# Patient Record
Sex: Male | Born: 1935 | Race: White | Hispanic: No
Health system: Southern US, Community
[De-identification: ages and names within clinical notes are randomized; demographics above are authoritative.]

---

## 2019-10-13 ENCOUNTER — Other Ambulatory Visit: Payer: Self-pay | Admitting: Adult Health Nurse Practitioner

## 2019-10-13 DIAGNOSIS — I480 Paroxysmal atrial fibrillation: Secondary | ICD-10-CM

## 2019-10-18 ENCOUNTER — Ambulatory Visit (INDEPENDENT_AMBULATORY_CARE_PROVIDER_SITE_OTHER): Payer: No Typology Code available for payment source

## 2019-10-18 ENCOUNTER — Other Ambulatory Visit: Payer: Self-pay

## 2019-10-18 DIAGNOSIS — I480 Paroxysmal atrial fibrillation: Secondary | ICD-10-CM

## 2019-10-18 LAB — ECHOCARDIOGRAM COMPLETE
Area-P 1/2: 2.26 cm2
S' Lateral: 1.9 cm

## 2019-12-22 ENCOUNTER — Emergency Department: Payer: No Typology Code available for payment source

## 2019-12-22 ENCOUNTER — Emergency Department
Admission: EM | Admit: 2019-12-22 | Discharge: 2019-12-22 | Disposition: A | Discharge status: Home / Self Care | Payer: No Typology Code available for payment source | Attending: Emergency Medicine | Admitting: Emergency Medicine

## 2019-12-22 ENCOUNTER — Other Ambulatory Visit: Payer: Self-pay

## 2019-12-22 DIAGNOSIS — R61 Generalized hyperhidrosis: Secondary | ICD-10-CM | POA: Diagnosis not present

## 2019-12-22 DIAGNOSIS — R55 Syncope and collapse: Secondary | ICD-10-CM | POA: Diagnosis not present

## 2019-12-22 LAB — URINALYSIS, COMPLETE (UACMP) WITH MICROSCOPIC
Bilirubin Urine: NEGATIVE
Glucose, UA: 50 mg/dL — AB
Hgb urine dipstick: NEGATIVE
Ketones, ur: NEGATIVE mg/dL
Leukocytes,Ua: NEGATIVE
Nitrite: NEGATIVE
Protein, ur: NEGATIVE mg/dL
Specific Gravity, Urine: 1.01 (ref 1.005–1.030)
Squamous Epithelial / HPF: NONE SEEN (ref 0–5)
pH: 5 (ref 5.0–8.0)

## 2019-12-22 LAB — HEPATIC FUNCTION PANEL
ALT: 16 U/L (ref 0–44)
AST: 19 U/L (ref 15–41)
Albumin: 3.8 g/dL (ref 3.5–5.0)
Alkaline Phosphatase: 56 U/L (ref 38–126)
Bilirubin, Direct: 0.1 mg/dL (ref 0.0–0.2)
Indirect Bilirubin: 0.6 mg/dL (ref 0.3–0.9)
Total Bilirubin: 0.7 mg/dL (ref 0.3–1.2)
Total Protein: 6.8 g/dL (ref 6.5–8.1)

## 2019-12-22 LAB — TROPONIN I (HIGH SENSITIVITY): Troponin I (High Sensitivity): 13 ng/L (ref <18)

## 2019-12-22 LAB — CBC
HCT: 37.2 % — ABNORMAL LOW (ref 39.0–52.0)
Hemoglobin: 12.3 g/dL — ABNORMAL LOW (ref 13.0–17.0)
MCH: 31.6 pg (ref 26.0–34.0)
MCHC: 33.1 g/dL (ref 30.0–36.0)
MCV: 95.6 fL (ref 80.0–100.0)
Platelets: 220 10*3/uL (ref 150–400)
RBC: 3.89 MIL/uL — ABNORMAL LOW (ref 4.22–5.81)
RDW: 13 % (ref 11.5–15.5)
WBC: 8.9 10*3/uL (ref 4.0–10.5)
nRBC: 0 % (ref 0.0–0.2)

## 2019-12-22 LAB — BASIC METABOLIC PANEL
Anion gap: 12 (ref 5–15)
BUN: 48 mg/dL — ABNORMAL HIGH (ref 8–23)
CO2: 23 mmol/L (ref 22–32)
Calcium: 8.5 mg/dL — ABNORMAL LOW (ref 8.9–10.3)
Chloride: 105 mmol/L (ref 98–111)
Creatinine, Ser: 1.49 mg/dL — ABNORMAL HIGH (ref 0.61–1.24)
GFR, Estimated: 42 mL/min — ABNORMAL LOW (ref >60)
Glucose, Bld: 147 mg/dL — ABNORMAL HIGH (ref 70–99)
Potassium: 4.4 mmol/L (ref 3.5–5.1)
Sodium: 140 mmol/L (ref 135–145)

## 2019-12-22 LAB — LIPASE, BLOOD: Lipase: 40 U/L (ref 11–51)

## 2019-12-22 NOTE — Discharge Instructions (Addendum)
Return to the ER for new, worsening, or persistent severe sweating, dizziness or lightheadedness, difficulty breathing, weakness, chest pain, or any other new or worsening symptoms that concern you.  Follow-up with your regular doctor.

## 2019-12-22 NOTE — ED Provider Notes (Signed)
[facility] Emergency Department Provider Note ____________________________________________   First MD Initiated Contact with Patient 12/22/19 1554     (approximate)  I have reviewed the triage vital signs and the nursing notes.   HISTORY  Chief Complaint Diaphoresis    HPI Austin Massey is a 86 y.o. male presents with an episode of sweating and near syncope, acute onset around 2 hours ago when he was at home.  The patient states that he started to sweat profusely, felt "woozy," and had abdominal cramping.  This started after he had been straining to have a bowel movement.  Per his daughter, the patient appeared gray.  He denies any associated chest pain or shortness of breath.  He states that subsequently all of the symptoms resolved and he now feels back to normal.  He did not eat anything unusual yesterday or today.  He otherwise has not been constipated recently.   No past medical history on file.  There are no problems to display for this patient.    Prior to Admission medications   Not on File    Allergies Patient has no known allergies.  No family history on file.  Social History Social History   Tobacco Use  . Smoking status: Not on file  Substance Use Topics  . Alcohol use: Not on file  . Drug use: Not on file    Review of Systems  Constitutional: No fever/chills Eyes: No visual changes. ENT: No sore throat. Cardiovascular: Denies chest pain. Respiratory: Denies shortness of breath. Gastrointestinal: No nausea, vomiting, or diarrhea. Genitourinary: Negative for dysuria.  Musculoskeletal: Negative for back pain. Skin: Negative for rash. Neurological: Negative for headaches, focal weakness or numbness.   ____________________________________________   PHYSICAL EXAM:  VITAL SIGNS: ED Triage Vitals  Enc Vitals Group     BP 12/22/19 1548 129/61     Pulse Rate 12/22/19 1548 72     Resp 12/22/19 1548 16     Temp 12/22/19  1548 98.1 F (36.7 C)     Temp src --      SpO2 12/22/19 1548 95 %     Weight 12/22/19 1553 195 lb (88.5 kg)     Height 12/22/19 1553 5\' 10"  (1.778 m)     Head Circumference --      Peak Flow --      Pain Score 12/22/19 1552 0     Pain Loc --      Pain Edu? --      Excl. in GC? --     Constitutional: Alert and oriented.  Very well appearing for age, in no acute distress. Eyes: Conjunctivae are normal.  EOMI.  PERRLA. Head: Atraumatic. Nose: No congestion/rhinnorhea. Mouth/Throat: Mucous membranes are slightly dry. Neck: Normal range of motion.  Cardiovascular: Normal rate, regular rhythm. Grossly normal heart sounds.  Good peripheral circulation. Respiratory: Normal respiratory effort.  No retractions. Lungs CTAB. Gastrointestinal: Soft and nontender. No distention.  Genitourinary: No flank tenderness. Musculoskeletal: No lower extremity edema.  Extremities warm and well perfused.  Neurologic:  Normal speech and language.  Motor intact in all extremities.  Normal coordination.  No gross focal neurologic deficits are appreciated.  Skin:  Skin is warm and dry. No rash noted. Psychiatric: Mood and affect are normal. Speech and behavior are normal.  ____________________________________________   LABS (all labs ordered are listed, but only abnormal results are displayed)  Labs Reviewed  CBC - Abnormal; Notable for the following components:  Result Value   RBC 3.89 (*)    Hemoglobin 12.3 (*)    HCT 37.2 (*)    All other components within normal limits  URINALYSIS, COMPLETE (UACMP) WITH MICROSCOPIC - Abnormal; Notable for the following components:   Color, Urine YELLOW (*)    APPearance CLEAR (*)    Glucose, UA 50 (*)    Bacteria, UA RARE (*)    All other components within normal limits  BASIC METABOLIC PANEL - Abnormal; Notable for the following components:   Glucose, Bld 147 (*)    BUN 48 (*)    Creatinine, Ser 1.49 (*)    Calcium 8.5 (*)    GFR, Estimated 42 (*)     All other components within normal limits  LIPASE, BLOOD  HEPATIC FUNCTION PANEL  TROPONIN I (HIGH SENSITIVITY)  TROPONIN I (HIGH SENSITIVITY)   ____________________________________________  EKG  ED ECG REPORT I, Dionne Bucy, the attending physician, personally viewed and interpreted this ECG.  Date: 12/22/2019 EKG Time: 1543 Rate: 73 Rhythm: normal sinus rhythm Intervals: normal ST/T Wave abnormalities: normal Narrative Interpretation: no evidence of acute ischemia  ____________________________________________  RADIOLOGY  XR abdomen interpreted by me shows a normal bowel gas pattern with no evidence of obstruction  ____________________________________________   PROCEDURES  Procedure(s) performed: No  Procedures  Critical Care performed: No ____________________________________________   INITIAL IMPRESSION / ASSESSMENT AND PLAN / ED COURSE  Pertinent labs & imaging results that were available during my care of the patient were reviewed by me and considered in my medical decision making (see chart for details).  86 year old male presents from home after an episode of abdominal cramping and near syncope with lightheadedness and diaphoresis.  This occurred after he had been straining to have a bowel movement.  The symptoms have subsequently resolved.  He denies any other recent episodes like this.  On exam, the patient is overall very well-appearing for his age.  His vital signs are normal.  Physical exam is unremarkable.  The abdomen is soft and nontender.  EKG is nonischemic.  Overall presentation is most consistent with vasovagal near syncope or other benign etiology.  The patient may be mildly dehydrated.  I have a low suspicion for cardiac etiology given the somewhat gradual onset of the symptoms and the lack of any palpitations, chest pain or shortness of breath.  There is no evidence of PE given the resolved symptoms.  I do not suspect SBO, volvulus, or  other acute abdominal pathology again given the resolved symptoms and lack of any tenderness on exam.  We will obtain basic labs, troponin, UA, abdominal x-ray and reassess.  ----------------------------------------- 6:28 PM on 12/22/2019 -----------------------------------------  Lab work-up is unremarkable. The creatinine is mildly elevated. The patient's daughter, who is an Char[employer] fundraiser, confirms that this is his baseline. Abdominal x-ray shows no acute abnormality. The patient continues to feel well and has had no recurrence of his symptoms while in the ED. He would like to go home. I feel that this is reasonable, and there is no specific indication for further ED observation or admission. I gave the patient and his daughter thorough return precautions, and they expressed understanding. ____________________________________________   FINAL CLINICAL IMPRESSION(S) / ED DIAGNOSES  Final diagnoses:  Near syncope      NEW MEDICATIONS STARTED DURING THIS VISIT:  There are no discharge medications for this patient.    Note:  This document was prepared using Dragon voice recognition software and may include unintentional dictation errors.  Dionne Bucy, MD 12/23/19 [phone]

## 2019-12-22 NOTE — ED Triage Notes (Signed)
Patient reports after eating breakfast he went to have a bowel movement and started having severe diaphoresis and stomach pains. Patient reports episode lasted approx 1 hour. Upon EMS arrival patient reports all symptoms have passed and he feels back to baseline. Patient A&OX3. Ambulatory with steady gait.

## 2021-06-16 IMAGING — CR DG ABD PORTABLE 2V
3 series · 3 of 3 positions shown · non-contrast
Comparison: None.

CLINICAL DATA: Abdominal pain, constipation

EXAM:
PORTABLE ABDOMEN - 2 VIEW

[abdomen erect]
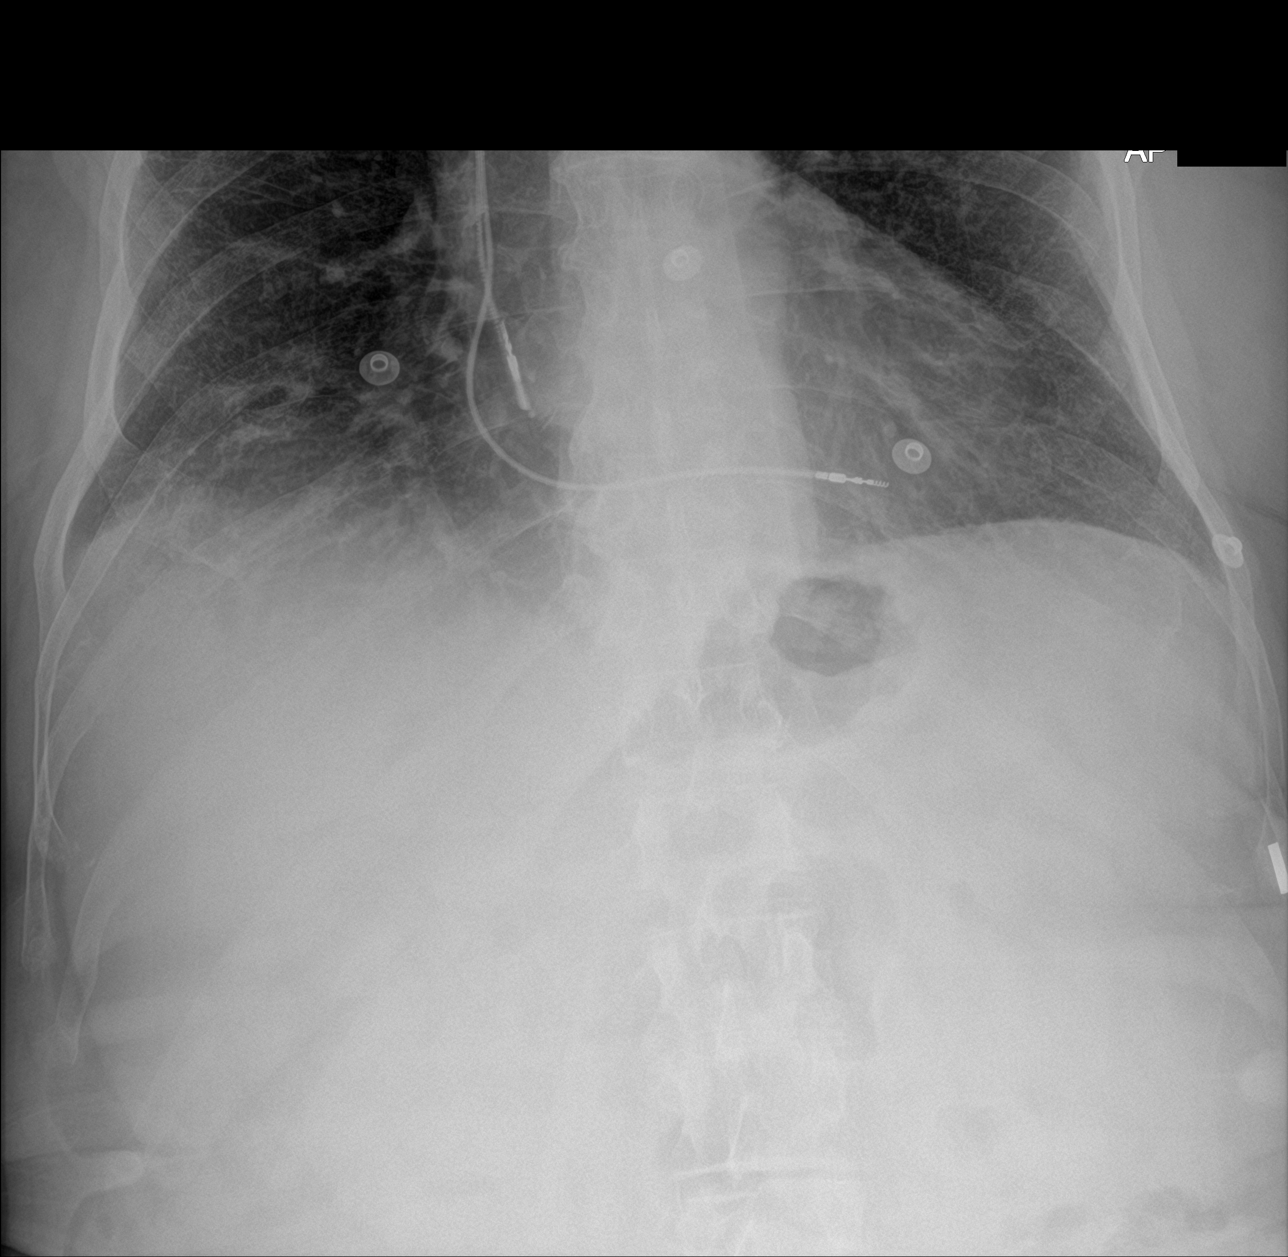

[abdomen supine (1 of 2)]
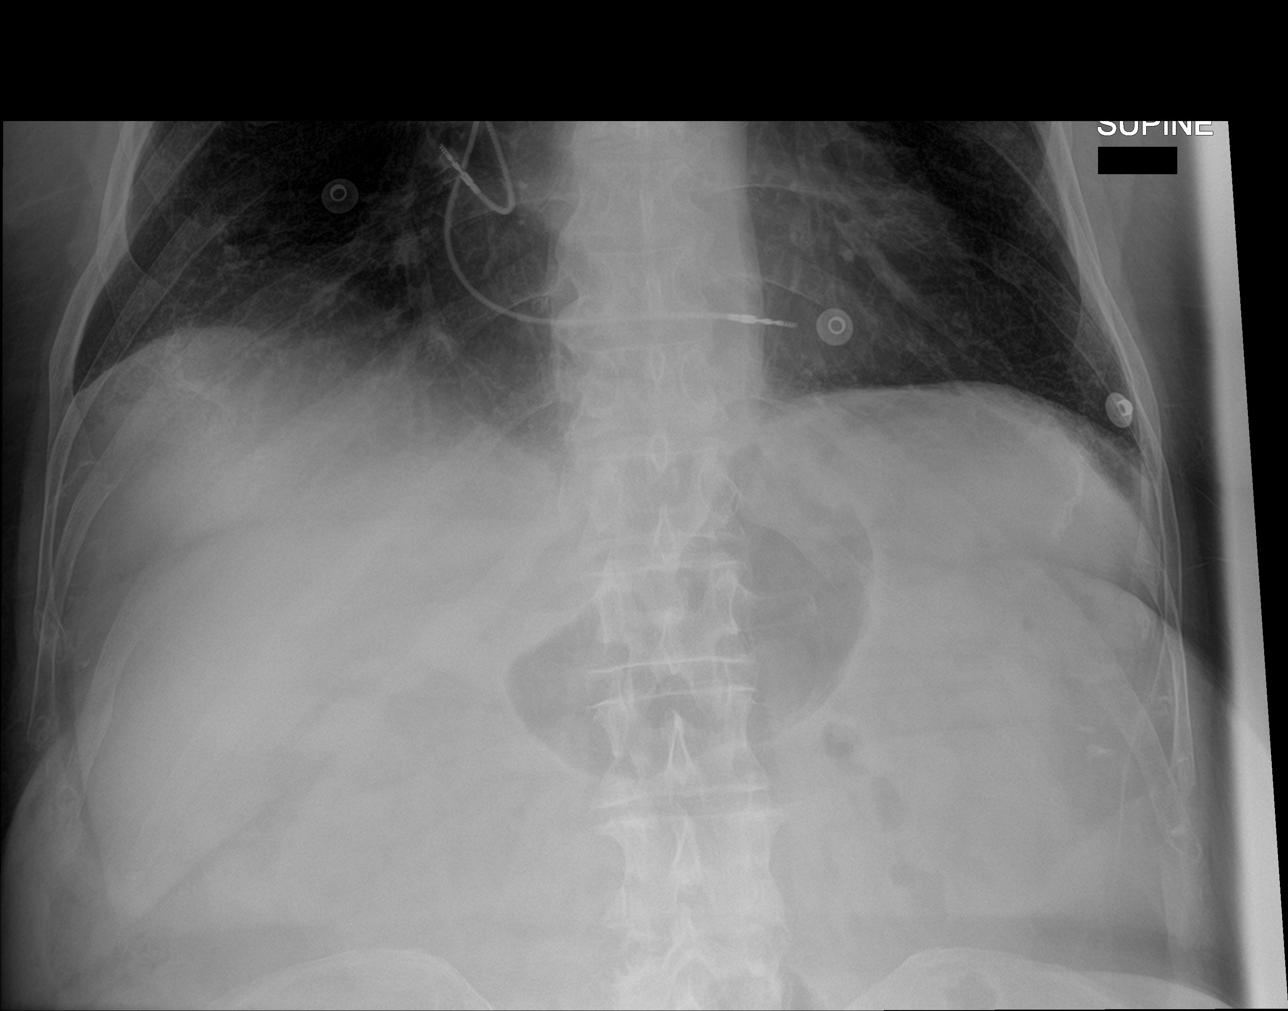

[abdomen supine (2 of 2)]
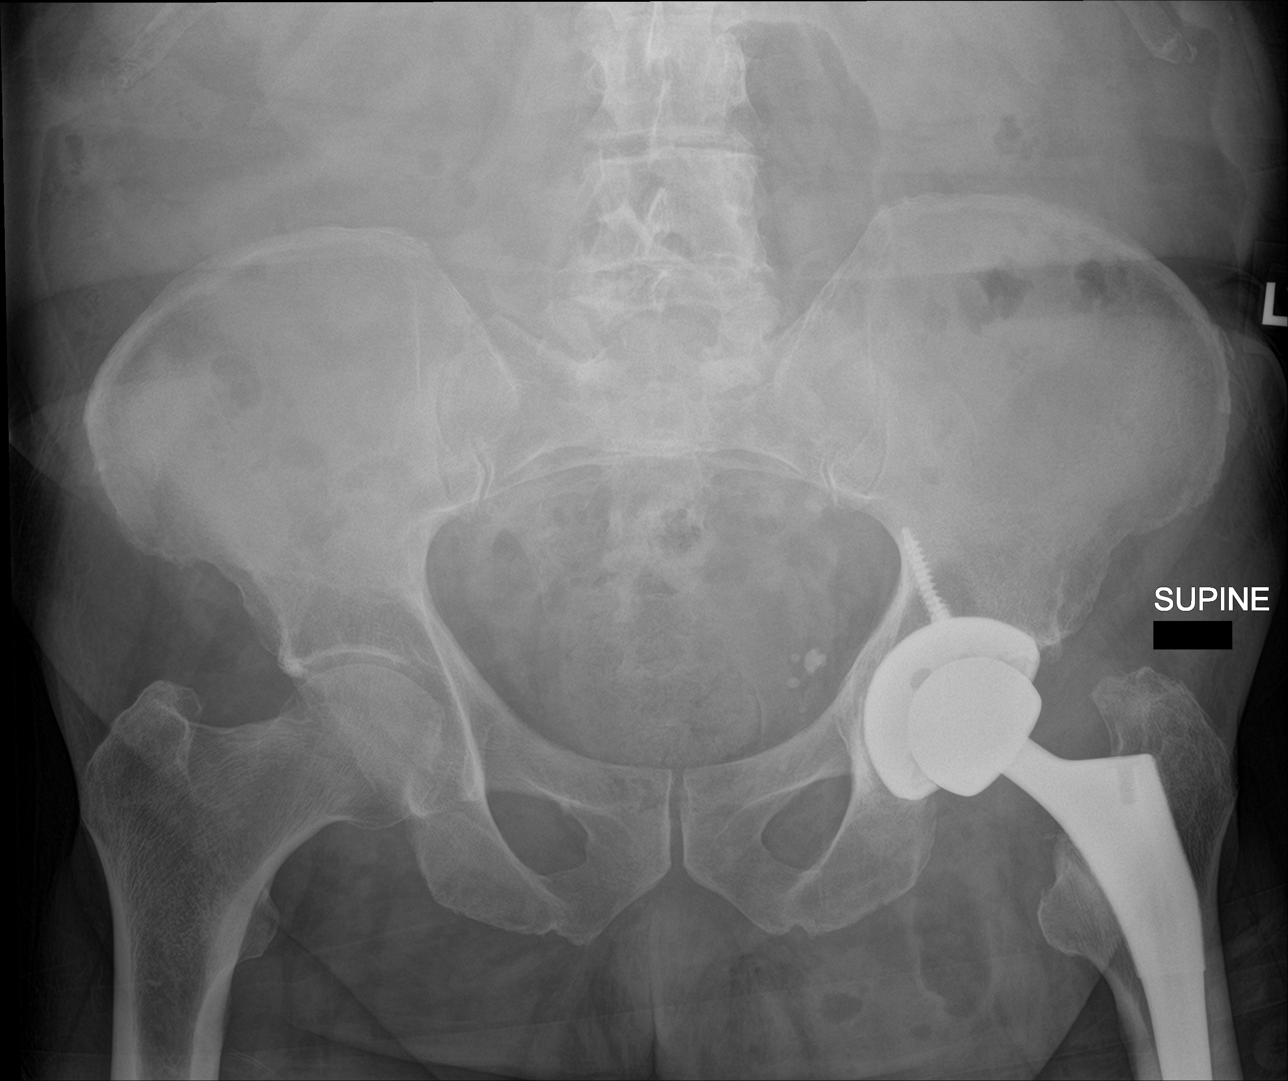

[3 of 3 positions shown; findings below may reference images not displayed]

FINDINGS: Supine and upright frontal views of the abdomen and pelvis are
obtained. Bowel gas pattern is unremarkable without obstruction or
ileus. There are no masses or abnormal calcifications. Vascular
calcifications are seen in the left hemipelvis. No free gas in the
greater peritoneal sac. Lung bases are clear.
IMPRESSION: 1. Unremarkable bowel gas pattern.

## 2023-05-16 ENCOUNTER — Emergency Department

## 2023-05-16 ENCOUNTER — Emergency Department
Admission: EM | Admit: 2023-05-16 | Discharge: 2023-05-16 | Disposition: A | Discharge status: Home / Self Care | Attending: Emergency Medicine | Admitting: Emergency Medicine

## 2023-05-16 DIAGNOSIS — Z7901 Long term (current) use of anticoagulants: Secondary | ICD-10-CM | POA: Diagnosis not present

## 2023-05-16 DIAGNOSIS — N23 Unspecified renal colic: Secondary | ICD-10-CM | POA: Diagnosis not present

## 2023-05-16 DIAGNOSIS — M7989 Other specified soft tissue disorders: Secondary | ICD-10-CM | POA: Insufficient documentation

## 2023-05-16 DIAGNOSIS — M25572 Pain in left ankle and joints of left foot: Secondary | ICD-10-CM

## 2023-05-16 DIAGNOSIS — R109 Unspecified abdominal pain: Secondary | ICD-10-CM | POA: Diagnosis present

## 2023-05-16 LAB — URINALYSIS, ROUTINE W REFLEX MICROSCOPIC
Bilirubin Urine: NEGATIVE
Glucose, UA: NEGATIVE mg/dL
Hgb urine dipstick: NEGATIVE
Ketones, ur: NEGATIVE mg/dL
Leukocytes,Ua: NEGATIVE
Nitrite: NEGATIVE
Protein, ur: NEGATIVE mg/dL
Specific Gravity, Urine: 1.013 (ref 1.005–1.030)
pH: 5 (ref 5.0–8.0)

## 2023-05-16 LAB — CBC
HCT: 38.6 % — ABNORMAL LOW (ref 39.0–52.0)
Hemoglobin: 13.1 g/dL (ref 13.0–17.0)
MCH: 31.3 pg (ref 26.0–34.0)
MCHC: 33.9 g/dL (ref 30.0–36.0)
MCV: 92.1 fL (ref 80.0–100.0)
Platelets: 266 10*3/uL (ref 150–400)
RBC: 4.19 MIL/uL — ABNORMAL LOW (ref 4.22–5.81)
RDW: 12.4 % (ref 11.5–15.5)
WBC: 7.7 10*3/uL (ref 4.0–10.5)
nRBC: 0 % (ref 0.0–0.2)

## 2023-05-16 LAB — COMPREHENSIVE METABOLIC PANEL WITH GFR
ALT: 15 U/L (ref 0–44)
AST: 18 U/L (ref 15–41)
Albumin: 4.1 g/dL (ref 3.5–5.0)
Alkaline Phosphatase: 58 U/L (ref 38–126)
Anion gap: 9 (ref 5–15)
BUN: 42 mg/dL — ABNORMAL HIGH (ref 8–23)
CO2: 25 mmol/L (ref 22–32)
Calcium: 9.1 mg/dL (ref 8.9–10.3)
Chloride: 101 mmol/L (ref 98–111)
Creatinine, Ser: 1.49 mg/dL — ABNORMAL HIGH (ref 0.61–1.24)
GFR, Estimated: 41 mL/min — ABNORMAL LOW (ref >60)
Glucose, Bld: 116 mg/dL — ABNORMAL HIGH (ref 70–99)
Potassium: 4.4 mmol/L (ref 3.5–5.1)
Sodium: 135 mmol/L (ref 135–145)
Total Bilirubin: 0.8 mg/dL (ref 0.0–1.2)
Total Protein: 7.4 g/dL (ref 6.5–8.1)

## 2023-05-16 LAB — LIPASE, BLOOD: Lipase: 43 U/L (ref 11–51)

## 2023-05-16 MED ORDER — IOHEXOL 300 MG/ML  SOLN
80.0000 mL | Freq: Once | INTRAMUSCULAR | Status: AC | PRN
  Administered 2023-05-16: 80 mL via INTRAVENOUS

## 2023-05-16 NOTE — ED Provider Notes (Signed)
 [facility] Provider Note    Event Date/Time   First MD Initiated Contact with Patient 05/16/23 1207     (approximate)   History   Abdominal Pain   HPI  Austin Massey is a 89 y.o. male who presents to the ED for evaluation of Abdominal Pain   I attempted to review [facility] from an [facility] this past January.Anticoagulated on Eliquis  Patient presents to the ED alongside her daughter for evaluation of episode of RLQ abdominal pain earlier this morning as well as 1-2 days of left ankle pain.  They are most concerned about his left ankle pain and recurrence of a DVT.  He is a history of DVT and recently stopped his Eliquis just in the past few days.  Ankle pain atraumatic and is consistent with previous DVT.   Patient reports feeling gassy and upset stomach with "indigestion" intermittently over the past 1 week but no discrete abdominal pain until this morning he had about a 1 hour long episode of RLQ pain without nausea, emesis, stool or urinary changes.  This has since resolved and he reports feeling fine.   Physical Exam   Triage Vital Signs: ED Triage Vitals  Encounter Vitals Group     BP 05/16/23 1150 101/84     Systolic BP Percentile --      Diastolic BP Percentile --      Pulse Rate 05/16/23 1150 73     Resp 05/16/23 1150 16     Temp 05/16/23 1150 97.7 F (36.5 C)     Temp Source 05/16/23 1150 Oral     SpO2 05/16/23 1150 96 %     Weight 05/16/23 1154 190 lb (86.2 kg)     Height 05/16/23 1154 5\' 10"  (1.778 m)     Head Circumference --      Peak Flow --      Pain Score 05/16/23 1153 5     Pain Loc --      Pain Education --      Exclude from Growth Chart --     Most recent vital signs: Vitals:   05/16/23 1300 05/16/23 1415  BP: 118/72   Pulse: (!) 59 64  Resp: 18   Temp:    SpO2: 95% 96%    General: Awake, no distress.  CV:  Good peripheral perfusion.  Resp:  Normal effort.  Abd:  No distention.  Soft and  nontender MSK:  No deformity noted.  Trace pitting edema bilaterally to lower extremities without asymmetry, skin changes, signs of trauma, swelling, bruising or notable changes on exam on the left lower extremity to the area of concern but compared to the right. Neuro:  No focal deficits appreciated. Other:     ED Results / Procedures / Treatments   Labs (all labs ordered are listed, but only abnormal results are displayed) Labs Reviewed  COMPREHENSIVE METABOLIC PANEL WITH GFR - Abnormal; Notable for the following components:      Result Value   Glucose, Bld 116 (*)    BUN 42 (*)    Creatinine, Ser 1.49 (*)    GFR, Estimated 41 (*)    All other components within normal limits  CBC - Abnormal; Notable for the following components:   RBC 4.19 (*)    HCT 38.6 (*)    All other components within normal limits  URINALYSIS, ROUTINE W REFLEX MICROSCOPIC - Abnormal; Notable for the following components:   Color, Urine YELLOW (*)  APPearance HAZY (*)    All other components within normal limits  LIPASE, BLOOD    EKG   RADIOLOGY CT abdomen/pelvis interpreted by me with a bladder stone Ultrasound of the left leg without signs of DVT  Official radiology report(s): US  Venous Img Lower Unilateral Left Result Date: 05/16/2023 CLINICAL DATA:  Left lower extremity swelling and pain for 3 days EXAM: LEFT LOWER EXTREMITY VENOUS DOPPLER ULTRASOUND TECHNIQUE: Gray-scale sonography with graded compression, as well as color Doppler and duplex ultrasound were performed to evaluate the lower extremity deep venous systems from the level of the common femoral vein and including the common femoral, femoral, profunda femoral, popliteal and calf veins including the posterior tibial, peroneal and gastrocnemius veins when visible. Spectral Doppler was utilized to evaluate flow at rest and with distal augmentation maneuvers in the common femoral, femoral and popliteal veins. COMPARISON:  None Available.  FINDINGS: Contralateral Common Femoral Vein: Respiratory phasicity is normal and symmetric with the symptomatic side. No evidence of thrombus. Normal compressibility. Common Femoral Vein: No evidence of thrombus. Normal compressibility, respiratory phasicity and response to augmentation. Saphenofemoral Junction: No evidence of thrombus. Normal compressibility and flow on color Doppler imaging. Profunda Femoral Vein: No evidence of thrombus. Normal compressibility and flow on color Doppler imaging. Femoral Vein: No evidence of thrombus. Normal compressibility, respiratory phasicity and response to augmentation. Popliteal Vein: No evidence of thrombus. Normal compressibility, respiratory phasicity and response to augmentation. Calf Veins: No evidence of thrombus. Normal compressibility and flow on color Doppler imaging. IMPRESSION: No evidence of deep venous thrombosis. Electronically Signed   By: Melven Stable.  Shick M.D.   On: 05/16/2023 15:14   CT ABDOMEN PELVIS W CONTRAST Result Date: 05/16/2023 CLINICAL DATA:  Right lower quadrant pain known left inguinal hernia. EXAM: CT ABDOMEN AND PELVIS WITH CONTRAST TECHNIQUE: Multidetector CT imaging of the abdomen and pelvis was performed using the standard protocol following bolus administration of intravenous contrast. RADIATION DOSE REDUCTION: This exam was performed according to the departmental dose-optimization program which includes automated exposure control, adjustment of the mA and/or kV according to patient size and/or use of iterative reconstruction technique. CONTRAST:  80mL OMNIPAQUE IOHEXOL 300 MG/ML  SOLN COMPARISON:  None Available. FINDINGS: Lower chest: Bibasilar interstitial thickening is nonspecific in this age group. More focal right middle lobe volume loss and scarring. Pacer ICD. Mild cardiomegaly. Hepatobiliary: Normal liver. Normal gallbladder, without biliary ductal dilatation. Pancreas: Normal, without mass or ductal dilatation. Spleen: Normal in size,  without focal abnormality. Adrenals/Urinary Tract: Normal adrenal glands. Bilateral fluid density renal lesions are likely cysts and do not warrant imaging follow-up. Example at up to 3.1 cm in the lower pole right kidney. a posterior lower pole right renal 3.2 cm lesion does measure slightly greater than fluid density on 23/7. No hydronephrosis. Degraded evaluation of the pelvis, secondary to beam hardening artifact from left hip arthroplasty. Stone in the dependent right paramidline bladder at 5 mm on 66/2. Stomach/Bowel: Tiny hiatal hernia. Proximal gastric underdistention. Scattered colonic diverticula. Nonobstructive sigmoid colon position within a large left inguinal hernia. Normal terminal ileum and appendix. Normal small bowel. Vascular/Lymphatic: Aortic atherosclerosis. Separate origin of the right hepatic artery off the aorta. No abdominopelvic adenopathy. Reproductive: Moderate prostatomegaly. Other: No significant free fluid. No free intraperitoneal air. Small fat containing paraumbilical hernia. Musculoskeletal: Left hip arthroplasty.  Lumbosacral spondylosis. IMPRESSION: 1. Right-sided bladder base stone. No upstream urinary tract calculi or hydronephrosis. 2. A right renal lesion measures slightly greater than fluid density and is most likely a  minimally complex cyst. Given patient age, this is of doubtful clinical significance. 3. Large left inguinal hernia containing nonobstructive sigmoid colon. 4.  Aortic Atherosclerosis (ICD10-I70.0). Electronically Signed   By: Lore Rode M.D.   On: 05/16/2023 14:42    PROCEDURES and INTERVENTIONS:  Procedures  Medications  iohexol (OMNIPAQUE) 300 MG/ML solution 80 mL (80 mLs Intravenous Contrast Given 05/16/23 1318)     IMPRESSION / MDM / ASSESSMENT AND PLAN / ED COURSE  I reviewed the triage vital signs and the nursing notes.  Differential diagnosis includes, but is not limited to, ureteral colic, appendicitis, SBO, DVT, cellulitis,  fracture  {Patient presents with symptoms of an acute illness or injury that is potentially life-threatening.  Patient presents with atraumatic ankle pain and concerns for DVT with a negative ultrasound.  Had an episode of abdominal pain this morning and CT with a bladder stone and likely had ureteral colic and since passed the stone.  Reassuring CBC, metabolic panel with renal function at baseline and normal lipase.  Patient is asymptomatic.  Suitable for outpatient management.  Clinical Course as of 05/16/23 1519  Sat May 16, 2023  1450 Reassessed and discussed CT results.  He reports feeling great and has no complaints or concerns. [DS]    Clinical Course User Index [DS] Arline Bennett, MD     FINAL CLINICAL IMPRESSION(S) / ED DIAGNOSES   Final diagnoses:  Acute left ankle pain  Ureteral colic     Rx / DC Orders   ED Discharge Orders     None        Note:  This document was prepared using Dragon voice recognition software and may include unintentional dictation errors.   Arline Bennett, MD 05/16/23 [id number]

## 2023-05-16 NOTE — ED Triage Notes (Signed)
 Patient from home via POV for a c/c of L ankle pain for the past few days. Patient report recent hx of L DVT and stopped taking blood thinners 2 days ago. R groin pain began today that is intermittent. Patient reports history of hernia on L side.

## 2023-05-16 NOTE — Discharge Instructions (Signed)
Use Tylenol for pain and fevers.  Up to 1000 mg per dose, up to 4 times per day.  Do not take more than 4000 mg of Tylenol/acetaminophen within 24 hours..
